# Patient Record
Sex: Male | Born: 1985 | Race: White | Hispanic: Yes | Marital: Married | Smoking: Never smoker
Health system: Southern US, Community
[De-identification: ages and names within clinical notes are randomized; demographics above are authoritative.]

## PROBLEM LIST (undated history)

## (undated) HISTORY — PX: OTHER SURGICAL HISTORY: SHX169

---

## 2008-07-30 ENCOUNTER — Inpatient Hospital Stay (HOSPITAL_COMMUNITY): Admission: EM | Admit: 2008-07-30 | Discharge: 2008-08-03 | Payer: Self-pay | Admitting: Emergency Medicine

## 2009-12-27 ENCOUNTER — Emergency Department (HOSPITAL_COMMUNITY): Admission: EM | Admit: 2009-12-27 | Discharge: 2009-12-27 | Payer: Self-pay | Admitting: Emergency Medicine

## 2010-08-25 LAB — BASIC METABOLIC PANEL
CO2: 21 mEq/L (ref 19–32)
Calcium: 8.8 mg/dL (ref 8.4–10.5)
Chloride: 108 mEq/L (ref 96–112)
GFR calc Af Amer: >60 mL/min (ref >60)
Glucose, Bld: 155 mg/dL — ABNORMAL HIGH (ref 70–99)
Potassium: 4.1 mEq/L (ref 3.5–5.1)
Sodium: 136 mEq/L (ref 135–145)

## 2010-08-25 LAB — POCT I-STAT, CHEM 8
BUN: 17 mg/dL (ref 6–23)
Chloride: 106 mEq/L (ref 96–112)
Creatinine, Ser: 1.4 mg/dL (ref 0.4–1.5)
Potassium: 3.4 mEq/L — ABNORMAL LOW (ref 3.5–5.1)
Sodium: 142 mEq/L (ref 135–145)
TCO2: 22 mmol/L (ref 0–100)

## 2010-08-25 LAB — CBC
HCT: 40.4 % (ref 39.0–52.0)
HCT: 42.7 % (ref 39.0–52.0)
Hemoglobin: 14.4 g/dL (ref 13.0–17.0)
Hemoglobin: 14.9 g/dL (ref 13.0–17.0)
MCHC: 35.3 g/dL (ref 30.0–36.0)
MCHC: 35.7 g/dL (ref 30.0–36.0)
MCV: 84.9 fL (ref 78.0–100.0)
MCV: 85.4 fL (ref 78.0–100.0)
MCV: 85.7 fL (ref 78.0–100.0)
Platelets: 230 10*3/uL (ref 150–400)
RBC: 4.38 MIL/uL (ref 4.22–5.81)
RBC: 4.76 MIL/uL (ref 4.22–5.81)
RDW: 12.9 % (ref 11.5–15.5)
RDW: 12.9 % (ref 11.5–15.5)
WBC: 12.8 10*3/uL — ABNORMAL HIGH (ref 4.0–10.5)

## 2010-08-25 LAB — TYPE AND SCREEN
ABO/RH(D): A POS
Antibody Screen: NEGATIVE

## 2010-08-25 LAB — ETHANOL: Alcohol, Ethyl (B): 88 mg/dL — ABNORMAL HIGH (ref 0–10)

## 2010-08-25 LAB — ABO/RH: ABO/RH(D): A POS
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
## 2011-04-12 ENCOUNTER — Emergency Department (HOSPITAL_COMMUNITY)
Admission: EM | Admit: 2011-04-12 | Discharge: 2011-04-12 | Disposition: A | Discharge status: Home / Self Care | Payer: Self-pay | Attending: Emergency Medicine | Admitting: Emergency Medicine

## 2011-04-12 ENCOUNTER — Encounter: Payer: Self-pay | Admitting: *Deleted

## 2011-04-12 DIAGNOSIS — R6889 Other general symptoms and signs: Secondary | ICD-10-CM | POA: Insufficient documentation

## 2011-04-12 DIAGNOSIS — M62838 Other muscle spasm: Secondary | ICD-10-CM | POA: Insufficient documentation

## 2011-04-12 DIAGNOSIS — S29011A Strain of muscle and tendon of front wall of thorax, initial encounter: Secondary | ICD-10-CM

## 2011-04-12 DIAGNOSIS — X500XXA Overexertion from strenuous movement or load, initial encounter: Secondary | ICD-10-CM | POA: Insufficient documentation

## 2011-04-12 DIAGNOSIS — M546 Pain in thoracic spine: Secondary | ICD-10-CM | POA: Insufficient documentation

## 2011-04-12 DIAGNOSIS — IMO0002 Reserved for concepts with insufficient information to code with codable children: Secondary | ICD-10-CM | POA: Insufficient documentation

## 2011-04-12 DIAGNOSIS — M25519 Pain in unspecified shoulder: Secondary | ICD-10-CM | POA: Insufficient documentation

## 2011-04-12 MED ORDER — CYCLOBENZAPRINE HCL 10 MG PO TABS: 5.0000 mg | ORAL_TABLET | Freq: Three times a day (TID) | ORAL | Status: AC | PRN

## 2011-04-12 MED ORDER — KETOROLAC TROMETHAMINE 30 MG/ML IJ SOLN
30.0000 mg | Freq: Once | INTRAMUSCULAR | Status: AC
  Administered 2011-04-12: 30 mg via INTRAMUSCULAR
  Filled 2011-04-12: qty 1

## 2011-04-12 MED ORDER — IBUPROFEN 600 MG PO TABS: 600.0000 mg | ORAL_TABLET | Freq: Four times a day (QID) | ORAL | Status: AC | PRN
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
## 2011-04-12 NOTE — ED Notes (Signed)
Pt was pulling something at work when he felt something in his back and he has had increasing pain in his right back since which has been associated with sob, pain increases when taking a deep breath

## 2011-04-13 NOTE — ED Provider Notes (Signed)
Medical screening examination/treatment/procedure(s) were performed by non-physician practitioner and as supervising physician I was immediately available for consultation/collaboration. Devoria Albe, MD, Armando Gang   Ward Givens, MD 04/13/11 [phone]

## 2011-07-31 ENCOUNTER — Emergency Department (HOSPITAL_COMMUNITY)
Admission: EM | Admit: 2011-07-31 | Discharge: 2011-07-31 | Disposition: A | Discharge status: Home / Self Care | Payer: Self-pay | Attending: Emergency Medicine | Admitting: Emergency Medicine

## 2011-07-31 ENCOUNTER — Other Ambulatory Visit: Payer: Self-pay

## 2011-07-31 ENCOUNTER — Encounter (HOSPITAL_COMMUNITY): Payer: Self-pay | Admitting: Emergency Medicine

## 2011-07-31 DIAGNOSIS — W57XXXA Bitten or stung by nonvenomous insect and other nonvenomous arthropods, initial encounter: Secondary | ICD-10-CM | POA: Insufficient documentation

## 2011-07-31 DIAGNOSIS — T148 Other injury of unspecified body region: Secondary | ICD-10-CM | POA: Insufficient documentation

## 2011-07-31 MED ORDER — SULFAMETHOXAZOLE-TRIMETHOPRIM 800-160 MG PO TABS: 1.0000 | ORAL_TABLET | Freq: Two times a day (BID) | ORAL | Status: AC

## 2011-07-31 NOTE — ED Notes (Signed)
Pt stated that he was bit by an unknown bug on last Friday. There is a small reddened area on the back of his left shin. Black scab in the middle or area. There is a larger reddened area on the front of his left shin. Scab in the middle, as well. The same type of spots of his left arm. No swelling in areas or no warmness to touch. Pt stated that he also having R sided chest pain that is intermittent, starting yesterday. No n/v or radiation of pain. Pt stated that he gets slightly SOB with pain. Will continue to monitor.

## 2011-07-31 NOTE — Discharge Instructions (Signed)
Insect Bite Mosquitoes, flies, fleas, bedbugs, and many other insects can bite. Insect bites are different from insect stings. A sting is when venom is injected into the skin. Some insect bites can transmit infectious diseases. SYMPTOMS  Insect bites usually turn red, swell, and itch for 2 to 4 days. They often go away on their own. TREATMENT  Your caregiver may prescribe antibiotic medicines if a bacterial infection develops in the bite. HOME CARE INSTRUCTIONS  Do not scratch the bite area.   Keep the bite area clean and dry. Wash the bite area thoroughly with soap and water.   Put ice or cool compresses on the bite area.   Put ice in a plastic bag.   Place a towel between your skin and the bag.   Leave the ice on for 20 minutes, 4 times a day for the first 2 to 3 days, or as directed.   You may apply a baking soda paste, cortisone cream, or calamine lotion to the bite area as directed by your caregiver. This can help reduce itching and swelling.   Only take over-the-counter or prescription medicines as directed by your caregiver.   If you are given antibiotics, take them as directed. Finish them even if you start to feel better.  You may need a tetanus shot if:  You cannot remember when you had your last tetanus shot.   You have never had a tetanus shot.   The injury broke your skin.  If you get a tetanus shot, your arm may swell, get red, and feel warm to the touch. This is common and not a problem. If you need a tetanus shot and you choose not to have one, there is a rare chance of getting tetanus. Sickness from tetanus can be serious. SEEK IMMEDIATE MEDICAL CARE IF:   You have increased pain, redness, or swelling in the bite area.   You see a red line on the skin coming from the bite.   You have a fever.   You have joint pain.   You have a headache or neck pain.   You have unusual weakness.   You have a rash.   You have chest pain or shortness of breath.   You  have abdominal pain, nausea, or vomiting.   You feel unusually tired or sleepy.  MAKE SURE YOU:   Understand these instructions.   Will watch your condition.   Will get help right away if you are not doing well or get worse.  Document Released: 06/08/2004 Document Revised: 04/20/2011 Document Reviewed: 11/30/2010 [facility] Patient Information 2012 [place], [place].  Return for any new or worsening symptoms or any other concerns.

## 2011-07-31 NOTE — ED Notes (Addendum)
Pt to ED for eval insect bite; pt reports on Friday, he was outside cleaning and he felt something bite him 2 spots on L arm and 2 L leg; sites look red and inflammed with black scab in middle; and redness noted to L upper arm
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
## 2011-08-02 NOTE — ED Provider Notes (Signed)
I saw and evaluated the patient, reviewed the resident's note and I agree with the findings and plan.   Lyanne Co, MD 08/02/11 [phone]

## 2011-12-07 ENCOUNTER — Emergency Department (HOSPITAL_COMMUNITY)
Admission: EM | Admit: 2011-12-07 | Discharge: 2011-12-07 | Disposition: A | Discharge status: Home / Self Care | Payer: Self-pay | Attending: Emergency Medicine | Admitting: Emergency Medicine

## 2011-12-07 ENCOUNTER — Encounter (HOSPITAL_COMMUNITY): Payer: Self-pay

## 2011-12-07 DIAGNOSIS — H7290 Unspecified perforation of tympanic membrane, unspecified ear: Secondary | ICD-10-CM

## 2011-12-07 DIAGNOSIS — H9209 Otalgia, unspecified ear: Secondary | ICD-10-CM | POA: Insufficient documentation

## 2011-12-07 DIAGNOSIS — H6690 Otitis media, unspecified, unspecified ear: Secondary | ICD-10-CM

## 2011-12-07 MED ORDER — OXYCODONE-ACETAMINOPHEN 5-325 MG PO TABS: 1.0000 | ORAL_TABLET | Freq: Four times a day (QID) | ORAL | Status: AC | PRN

## 2011-12-07 MED ORDER — AMOXICILLIN 500 MG PO CAPS: 500.0000 mg | ORAL_CAPSULE | Freq: Three times a day (TID) | ORAL | Status: AC
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
## 2011-12-07 NOTE — ED Notes (Signed)
Pt presents with 2 day h/o L ear pain.  Pt reports placing q-tip in ear, noting blood on tip.  Pt reports pain is constant and increases intermittently.

## 2012-05-05 ENCOUNTER — Encounter (HOSPITAL_COMMUNITY): Payer: Self-pay | Admitting: Emergency Medicine

## 2012-05-05 ENCOUNTER — Emergency Department (HOSPITAL_COMMUNITY): Payer: Self-pay

## 2012-05-05 ENCOUNTER — Emergency Department (HOSPITAL_COMMUNITY)
Admission: EM | Admit: 2012-05-05 | Discharge: 2012-05-05 | Disposition: A | Discharge status: Home / Self Care | Payer: Self-pay | Attending: Emergency Medicine | Admitting: Emergency Medicine

## 2012-05-05 DIAGNOSIS — S199XXA Unspecified injury of neck, initial encounter: Secondary | ICD-10-CM | POA: Insufficient documentation

## 2012-05-05 DIAGNOSIS — R109 Unspecified abdominal pain: Secondary | ICD-10-CM

## 2012-05-05 DIAGNOSIS — R079 Chest pain, unspecified: Secondary | ICD-10-CM

## 2012-05-05 DIAGNOSIS — S0993XA Unspecified injury of face, initial encounter: Secondary | ICD-10-CM | POA: Insufficient documentation

## 2012-05-05 DIAGNOSIS — R519 Headache, unspecified: Secondary | ICD-10-CM

## 2012-05-05 LAB — BASIC METABOLIC PANEL
Calcium: 9.6 mg/dL (ref 8.4–10.5)
Chloride: 99 mEq/L (ref 96–112)
Creatinine, Ser: 0.75 mg/dL (ref 0.50–1.35)
GFR calc Af Amer: >90 mL/min (ref >90)
Sodium: 137 mEq/L (ref 135–145)

## 2012-05-05 LAB — CBC
MCV: 83.3 fL (ref 78.0–100.0)
Platelets: 261 10*3/uL (ref 150–400)
RBC: 5.69 MIL/uL (ref 4.22–5.81)
RDW: 12.6 % (ref 11.5–15.5)
WBC: 14.8 10*3/uL — ABNORMAL HIGH (ref 4.0–10.5)

## 2012-05-05 MED ORDER — IOHEXOL 300 MG/ML  SOLN
100.0000 mL | Freq: Once | INTRAMUSCULAR | Status: AC | PRN
  Administered 2012-05-05: 100 mL via INTRAVENOUS

## 2012-05-05 MED ORDER — HYDROCODONE-ACETAMINOPHEN 5-325 MG PO TABS: 1.0000 | ORAL_TABLET | ORAL | Status: DC | PRN

## 2012-05-05 MED ORDER — SODIUM CHLORIDE 0.9 % IV SOLN
Freq: Once | INTRAVENOUS | Status: AC
  Administered 2012-05-05: 125 mL/h via INTRAVENOUS

## 2012-05-05 MED ORDER — MORPHINE SULFATE 4 MG/ML IJ SOLN
6.0000 mg | Freq: Once | INTRAMUSCULAR | Status: AC
  Administered 2012-05-05: 6 mg via INTRAVENOUS
  Filled 2012-05-05: qty 2

## 2012-05-05 MED ORDER — SODIUM CHLORIDE 0.9 % IV SOLN
1000.0000 mL | INTRAVENOUS | Status: DC
  Administered 2012-05-05: 1000 mL via INTRAVENOUS

## 2012-05-05 MED ORDER — SODIUM CHLORIDE 0.9 % IV SOLN
1000.0000 mL | Freq: Once | INTRAVENOUS | Status: AC
  Administered 2012-05-05: 1000 mL via INTRAVENOUS
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
## 2012-05-05 NOTE — ED Notes (Signed)
Bed:WA12<BR> Expected date:<BR> Expected time:<BR> Means of arrival:<BR> Comments:<BR> EMS
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
## 2012-05-05 NOTE — ED Notes (Signed)
Placed [place] Cervical collar in pt per MD.

## 2012-07-27 ENCOUNTER — Encounter (HOSPITAL_COMMUNITY): Payer: Self-pay | Admitting: Emergency Medicine

## 2012-07-27 ENCOUNTER — Emergency Department (HOSPITAL_COMMUNITY): Payer: Self-pay

## 2012-07-27 ENCOUNTER — Emergency Department (HOSPITAL_COMMUNITY)
Admission: EM | Admit: 2012-07-27 | Discharge: 2012-07-27 | Disposition: A | Discharge status: Home / Self Care | Payer: Self-pay | Attending: Emergency Medicine | Admitting: Emergency Medicine

## 2012-07-27 DIAGNOSIS — R059 Cough, unspecified: Secondary | ICD-10-CM | POA: Insufficient documentation

## 2012-07-27 DIAGNOSIS — R51 Headache: Secondary | ICD-10-CM | POA: Insufficient documentation

## 2012-07-27 DIAGNOSIS — J040 Acute laryngitis: Secondary | ICD-10-CM | POA: Insufficient documentation

## 2012-07-27 DIAGNOSIS — J209 Acute bronchitis, unspecified: Secondary | ICD-10-CM | POA: Insufficient documentation

## 2012-07-27 MED ORDER — HYDROCODONE-HOMATROPINE 5-1.5 MG/5ML PO SYRP: 5.0000 mL | ORAL_SOLUTION | Freq: Four times a day (QID) | ORAL | Status: AC | PRN

## 2012-07-27 MED ORDER — OMEPRAZOLE 20 MG PO CPDR: 20.0000 mg | DELAYED_RELEASE_CAPSULE | Freq: Every day | ORAL | Status: AC

## 2012-07-27 MED ORDER — AZITHROMYCIN 250 MG PO TABS: 250.0000 mg | ORAL_TABLET | Freq: Every day | ORAL | Status: AC

## 2012-07-27 NOTE — ED Notes (Signed)
Pt c/o voice being hoarse x's 1 week.  Pt denies sore throat or any other complaints

## 2012-07-27 NOTE — ED Provider Notes (Signed)
Medical screening examination/treatment/procedure(s) were performed by non-physician practitioner and as supervising physician I was immediately available for consultation/collaboration.    Kiev Labrosse D Jaquez Farrington, MD 07/27/12 2357 
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
## 2014-01-06 IMAGING — CR DG CHEST 2V
2 series · 2 of 2 positions shown · non-contrast
Comparison: 05/05/2012 there are.

CLINICAL DATA: Shortness of breath.  Using his voice.  Symptoms for
1 week.  Productive cough.

CHEST - 2 VIEW

[w chest pa]
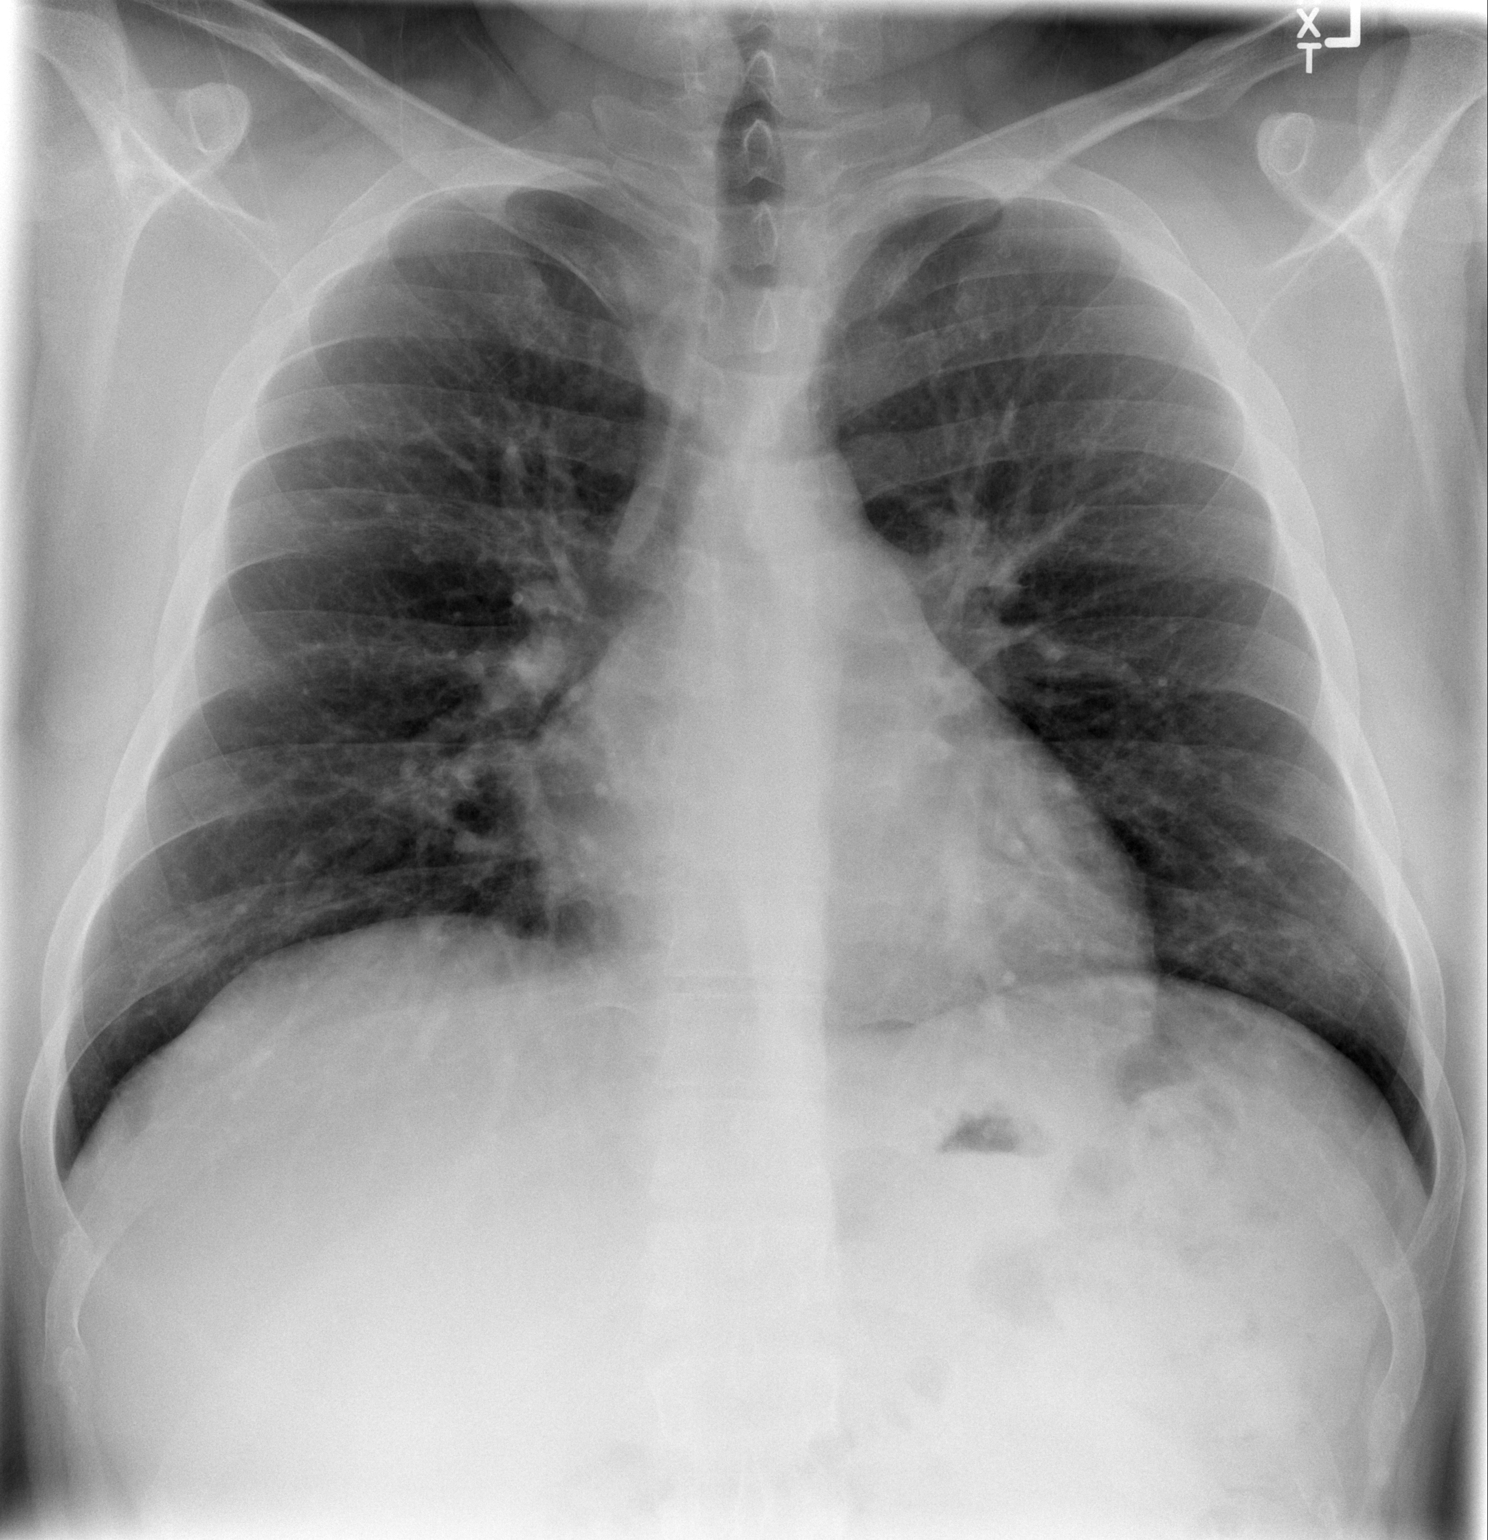

[w chest lat]
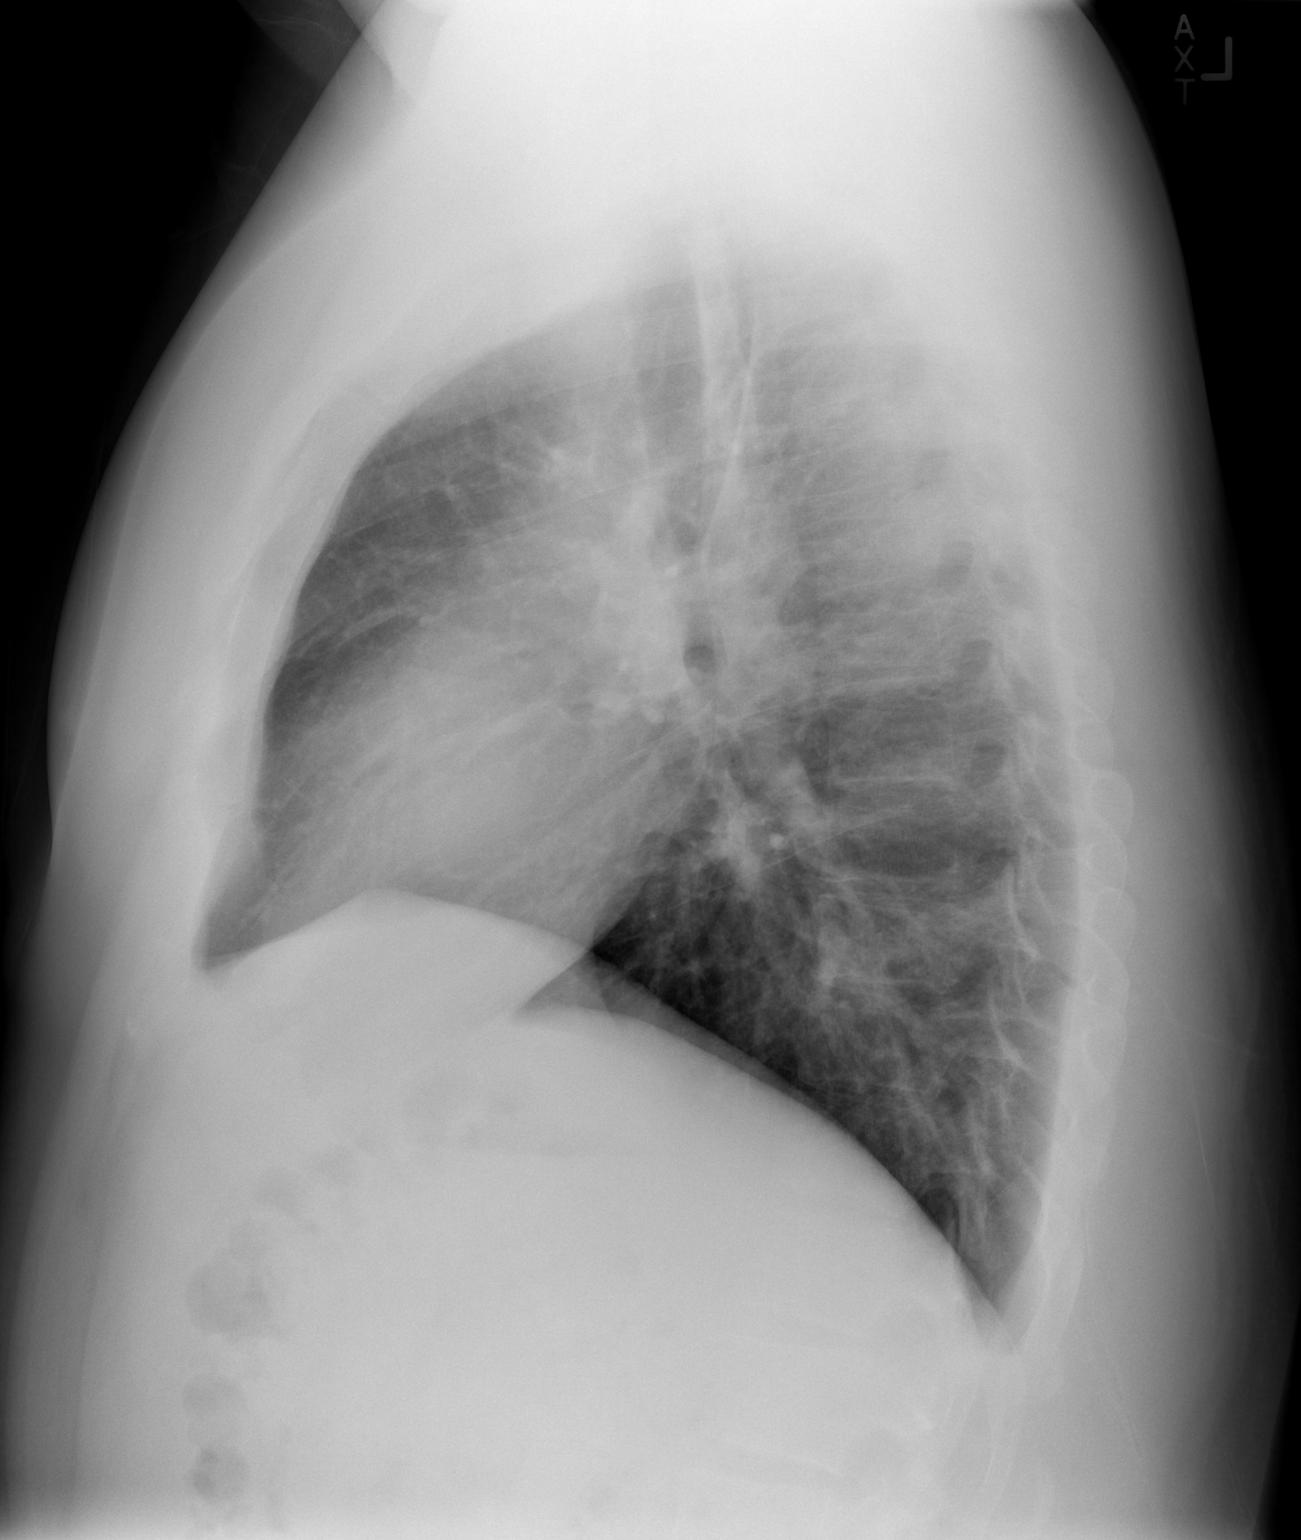

[2 of 2 positions shown; findings below may reference images not displayed]

FINDINGS: Heart size is normal.  There is perihilar peribronchial
thickening.  There are no focal consolidations or pleural
effusions. Visualized osseous structures have a normal appearance.
IMPRESSION: 1.  Bronchitic change.
2. No focal pulmonary abnormality.

## 2021-12-01 ENCOUNTER — Emergency Department (HOSPITAL_COMMUNITY)
Admission: EM | Admit: 2021-12-01 | Discharge: 2021-12-01 | Disposition: A | Discharge status: Home / Self Care | Payer: Self-pay | Attending: Emergency Medicine | Admitting: Emergency Medicine

## 2021-12-01 ENCOUNTER — Other Ambulatory Visit: Payer: Self-pay

## 2021-12-01 ENCOUNTER — Emergency Department (HOSPITAL_COMMUNITY): Payer: Self-pay

## 2021-12-01 ENCOUNTER — Encounter (HOSPITAL_COMMUNITY): Payer: Self-pay | Admitting: *Deleted

## 2021-12-01 DIAGNOSIS — M545 Low back pain, unspecified: Secondary | ICD-10-CM | POA: Insufficient documentation

## 2021-12-01 DIAGNOSIS — M79669 Pain in unspecified lower leg: Secondary | ICD-10-CM | POA: Insufficient documentation

## 2021-12-01 DIAGNOSIS — R1030 Lower abdominal pain, unspecified: Secondary | ICD-10-CM

## 2021-12-01 DIAGNOSIS — R109 Unspecified abdominal pain: Secondary | ICD-10-CM

## 2021-12-01 LAB — BASIC METABOLIC PANEL
Anion gap: 9 (ref 5–15)
BUN: 16 mg/dL (ref 6–20)
CO2: 24 mmol/L (ref 22–32)
Calcium: 9.3 mg/dL (ref 8.9–10.3)
Chloride: 106 mmol/L (ref 98–111)
Creatinine, Ser: 0.8 mg/dL (ref 0.61–1.24)
GFR, Estimated: >60 mL/min (ref >60)
Glucose, Bld: 128 mg/dL — ABNORMAL HIGH (ref 70–99)
Potassium: 3.8 mmol/L (ref 3.5–5.1)
Sodium: 139 mmol/L (ref 135–145)

## 2021-12-01 LAB — URINALYSIS, ROUTINE W REFLEX MICROSCOPIC
Bilirubin Urine: NEGATIVE
Glucose, UA: NEGATIVE mg/dL
Hgb urine dipstick: NEGATIVE
Ketones, ur: NEGATIVE mg/dL
Leukocytes,Ua: NEGATIVE
Nitrite: NEGATIVE
Protein, ur: NEGATIVE mg/dL
Specific Gravity, Urine: 1.025 (ref 1.005–1.030)
pH: 5 (ref 5.0–8.0)

## 2021-12-01 LAB — CBC WITH DIFFERENTIAL/PLATELET
Abs Immature Granulocytes: 0.04 10*3/uL (ref 0.00–0.07)
Basophils Absolute: 0 10*3/uL (ref 0.0–0.1)
Basophils Relative: 1 %
Eosinophils Absolute: 0.3 10*3/uL (ref 0.0–0.5)
Eosinophils Relative: 3 %
HCT: 44.4 % (ref 39.0–52.0)
Hemoglobin: 15.3 g/dL (ref 13.0–17.0)
Immature Granulocytes: 1 %
Lymphocytes Relative: 24 %
Lymphs Abs: 2.1 10*3/uL (ref 0.7–4.0)
MCH: 30.7 pg (ref 26.0–34.0)
MCHC: 34.5 g/dL (ref 30.0–36.0)
MCV: 89 fL (ref 80.0–100.0)
Monocytes Absolute: 0.8 10*3/uL (ref 0.1–1.0)
Monocytes Relative: 9 %
Neutro Abs: 5.6 10*3/uL (ref 1.7–7.7)
Neutrophils Relative %: 62 %
Platelets: 201 10*3/uL (ref 150–400)
RBC: 4.99 MIL/uL (ref 4.22–5.81)
RDW: 12.3 % (ref 11.5–15.5)
WBC: 8.8 10*3/uL (ref 4.0–10.5)
nRBC: 0 % (ref 0.0–0.2)

## 2021-12-01 MED ORDER — PHENAZOPYRIDINE HCL 100 MG PO TABS
200.0000 mg | ORAL_TABLET | Freq: Once | ORAL | Status: AC
  Administered 2021-12-01: 200 mg via ORAL
  Filled 2021-12-01: qty 2

## 2021-12-01 MED ORDER — SODIUM CHLORIDE 0.9 % IV BOLUS
1000.0000 mL | Freq: Once | INTRAVENOUS | Status: AC
  Administered 2021-12-01: 1000 mL via INTRAVENOUS

## 2021-12-01 MED ORDER — IOHEXOL 300 MG/ML  SOLN
100.0000 mL | Freq: Once | INTRAMUSCULAR | Status: AC | PRN
  Administered 2021-12-01: 100 mL via INTRAVENOUS

## 2021-12-01 MED ORDER — NAPROXEN 500 MG PO TABS: 500.0000 mg | ORAL_TABLET | Freq: Two times a day (BID) | ORAL | 0 refills | Status: AC

## 2021-12-01 MED ORDER — OXYCODONE-ACETAMINOPHEN 5-325 MG PO TABS
1.0000 | ORAL_TABLET | Freq: Once | ORAL | Status: AC
  Administered 2021-12-01: 1 via ORAL
  Filled 2021-12-01: qty 1

## 2021-12-01 MED ORDER — KETOROLAC TROMETHAMINE 30 MG/ML IJ SOLN
15.0000 mg | Freq: Once | INTRAMUSCULAR | Status: AC
  Administered 2021-12-01: 15 mg via INTRAVENOUS
  Filled 2021-12-01: qty 1

## 2021-12-01 MED ORDER — MORPHINE SULFATE 15 MG PO TABS
15.0000 mg | ORAL_TABLET | ORAL | Status: DC | PRN
  Filled 2021-12-01: qty 1

## 2021-12-01 MED ORDER — ACETAMINOPHEN 500 MG PO TABS: 500.0000 mg | ORAL_TABLET | Freq: Four times a day (QID) | ORAL | 0 refills | Status: AC | PRN

## 2021-12-01 MED ORDER — ACETAMINOPHEN 325 MG PO TABS
650.0000 mg | ORAL_TABLET | Freq: Four times a day (QID) | ORAL | Status: DC | PRN
Start: 2021-12-01 — End: 2021-12-02
  Administered 2021-12-01: 650 mg via ORAL
  Filled 2021-12-01: qty 2

## 2021-12-01 NOTE — ED Provider Triage Note (Signed)
  Emergency Medicine Provider Triage Evaluation Note  MRN:  [id number]  Arrival date & time: 12/01/21    Medically screening exam initiated at 6:21 AM.   CC:   Flank Pain   HPI:  Phillip Frazier is a 36 y.o. year-old male presents to the ED with chief complaint of left sided low back pain that has worsened tonight.  Can't find a comfortable position.  Denies fever or chills.  States he has had mild dysuria.  Denies hx of kidney stones.  History provided by patient. ROS:  -As included in HPI PE:   Vitals:   12/01/21 0618  BP: (!) 139/95  Pulse: 84  Resp: 18  Temp: 98.3 F (36.8 C)  SpO2: 100%    Non-toxic appearing No respiratory distress ambulatory MDM:  Based on signs and symptoms, kidney stone is highest on my differential,. I've ordered labs and imaging in triage to expedite lab/diagnostic workup.  Patient was informed that the remainder of the evaluation will be completed by another provider, this initial triage assessment does not replace that evaluation, and the importance of remaining in the ED until their evaluation is complete.    Roxy Horseman, PA-C 12/01/21 [id number]

## 2021-12-01 NOTE — Discharge Instructions (Addendum)
We saw you in the ER for flank pain and abdominal pain. All the results in the ER are normal, labs and imaging.  CT scan did not show any concerning findings. We are not sure what is causing your symptoms. The workup in the ER is not complete, and is limited to screening for life threatening and emergent conditions only, so please see a primary care doctor for further evaluation.
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
## 2021-12-01 NOTE — ED Notes (Signed)
All discharge instructions reviewed with patient and patient verbalized understanding of same. Patient stable and ambulatory at time of discharge.  ?

## 2021-12-01 NOTE — ED Triage Notes (Signed)
Patient presents to ed via [facility] states he has had lower back pain x 2 days, states he is't able to lay down denies injury. States pain upon urination at times.

## 2021-12-01 NOTE — ED Notes (Signed)
Patient transported to CT 

## 2023-01-18 ENCOUNTER — Emergency Department (HOSPITAL_BASED_OUTPATIENT_CLINIC_OR_DEPARTMENT_OTHER)
Admission: EM | Admit: 2023-01-18 | Discharge: 2023-01-18 | Disposition: A | Discharge status: Home / Self Care | Payer: Self-pay | Attending: Emergency Medicine | Admitting: Emergency Medicine

## 2023-01-18 ENCOUNTER — Other Ambulatory Visit: Payer: Self-pay

## 2023-01-18 ENCOUNTER — Encounter (HOSPITAL_BASED_OUTPATIENT_CLINIC_OR_DEPARTMENT_OTHER): Payer: Self-pay

## 2023-01-18 ENCOUNTER — Emergency Department (HOSPITAL_BASED_OUTPATIENT_CLINIC_OR_DEPARTMENT_OTHER): Payer: Self-pay

## 2023-01-18 DIAGNOSIS — R519 Headache, unspecified: Secondary | ICD-10-CM | POA: Insufficient documentation

## 2023-01-18 DIAGNOSIS — R079 Chest pain, unspecified: Secondary | ICD-10-CM | POA: Insufficient documentation

## 2023-01-18 LAB — COMPREHENSIVE METABOLIC PANEL
ALT: 107 U/L — ABNORMAL HIGH (ref 0–44)
AST: 68 U/L — ABNORMAL HIGH (ref 15–41)
Albumin: 3.9 g/dL (ref 3.5–5.0)
Alkaline Phosphatase: 70 U/L (ref 38–126)
Anion gap: 8 (ref 5–15)
BUN: 11 mg/dL (ref 6–20)
CO2: 25 mmol/L (ref 22–32)
Calcium: 8.9 mg/dL (ref 8.9–10.3)
Chloride: 103 mmol/L (ref 98–111)
Creatinine, Ser: 0.71 mg/dL (ref 0.61–1.24)
GFR, Estimated: >60 mL/min (ref >60)
Glucose, Bld: 189 mg/dL — ABNORMAL HIGH (ref 70–99)
Potassium: 3.7 mmol/L (ref 3.5–5.1)
Sodium: 136 mmol/L (ref 135–145)
Total Bilirubin: 0.4 mg/dL (ref 0.3–1.2)
Total Protein: 7.8 g/dL (ref 6.5–8.1)

## 2023-01-18 LAB — CBC WITH DIFFERENTIAL/PLATELET
Abs Immature Granulocytes: 0.03 10*3/uL (ref 0.00–0.07)
Basophils Absolute: 0 10*3/uL (ref 0.0–0.1)
Basophils Relative: 0 %
Eosinophils Absolute: 0.3 10*3/uL (ref 0.0–0.5)
Eosinophils Relative: 3 %
HCT: 43.3 % (ref 39.0–52.0)
Hemoglobin: 14.8 g/dL (ref 13.0–17.0)
Immature Granulocytes: 0 %
Lymphocytes Relative: 21 %
Lymphs Abs: 1.9 10*3/uL (ref 0.7–4.0)
MCH: 30.4 pg (ref 26.0–34.0)
MCHC: 34.2 g/dL (ref 30.0–36.0)
MCV: 88.9 fL (ref 80.0–100.0)
Monocytes Absolute: 0.8 10*3/uL (ref 0.1–1.0)
Monocytes Relative: 9 %
Neutro Abs: 6 10*3/uL (ref 1.7–7.7)
Neutrophils Relative %: 67 %
Platelets: 189 10*3/uL (ref 150–400)
RBC: 4.87 MIL/uL (ref 4.22–5.81)
RDW: 12.5 % (ref 11.5–15.5)
WBC: 9 10*3/uL (ref 4.0–10.5)
nRBC: 0 % (ref 0.0–0.2)

## 2023-01-18 LAB — TROPONIN I (HIGH SENSITIVITY): Troponin I (High Sensitivity): 3 ng/L (ref <18)

## 2023-01-18 LAB — LIPASE, BLOOD: Lipase: 63 U/L — ABNORMAL HIGH (ref 11–51)

## 2023-01-18 MED ORDER — DIPHENHYDRAMINE HCL 50 MG/ML IJ SOLN
12.5000 mg | Freq: Once | INTRAMUSCULAR | Status: AC
  Administered 2023-01-18: 12.5 mg via INTRAVENOUS
  Filled 2023-01-18: qty 1

## 2023-01-18 MED ORDER — PROCHLORPERAZINE EDISYLATE 10 MG/2ML IJ SOLN
10.0000 mg | Freq: Once | INTRAMUSCULAR | Status: AC
  Administered 2023-01-18: 10 mg via INTRAVENOUS
  Filled 2023-01-18: qty 2

## 2023-01-18 MED ORDER — ACETAMINOPHEN 325 MG PO TABS
650.0000 mg | ORAL_TABLET | Freq: Once | ORAL | Status: AC
  Administered 2023-01-18: 650 mg via ORAL
  Filled 2023-01-18: qty 2

## 2023-01-18 NOTE — ED Provider Notes (Signed)
[place] EMERGENCY DEPARTMENT AT [facility] Provider Note   CSN: [id number] Arrival date & time: 01/18/23  2104     History  Chief Complaint  Patient presents with   Chest Pain    Phillip Frazier is a 37 y.o. male.  Patient here with headache and chest pain after getting into argument.  He is having some tension to the back of his head, chest pain shortness of breath but improving.  Nothing makes it worse or better.  Happened just prior to arrival.  Maybe had some mild pressure yesterday but things seem to get worse just after this argument before coming here.  He felt like the right side of his eye was twitching.  He is not having any weakness numbness tingling vision loss.  Denies any alcohol or drug use.  Denies any recent surgery or travel.  Feeling better now.  The history is provided by the patient.       Home Medications Prior to Admission medications   Medication Sig Start Date End Date Taking? Authorizing Provider  acetaminophen (TYLENOL) 500 MG tablet Take 1 tablet (500 mg total) by mouth every 6 (six) hours as needed. 12/01/21   Derwood Kaplan, MD  azithromycin (ZITHROMAX) 250 MG tablet Take 1 tablet (250 mg total) by mouth daily. Take first 2 tablets together, then 1 every day until finished. 07/27/12   Muthersbaugh, Dahlia Client, PA-C  HYDROcodone-homatropine (HYCODAN) 5-1.5 MG/5ML syrup Take 5 mLs by mouth every 6 (six) hours as needed for cough. 07/27/12   Muthersbaugh, Dahlia Client, PA-C  naproxen (NAPROSYN) 500 MG tablet Take 1 tablet (500 mg total) by mouth 2 (two) times daily. 12/01/21   Derwood Kaplan, MD  omeprazole (PRILOSEC) 20 MG capsule Take 1 capsule (20 mg total) by mouth daily. 07/27/12   Muthersbaugh, Dahlia Client, PA-C      Allergies    Patient has no known allergies.    Review of Systems   Review of Systems  Physical Exam Updated Vital Signs BP (!) 155/83 (BP Location: Left Arm)   Pulse 77   Temp 98 F (36.7 C) (Oral)   Resp 18   Ht 5\' 7"   (1.702 m)   Wt 115.7 kg   SpO2 99%   BMI 39.94 kg/m  Physical Exam Vitals and nursing note reviewed.  Constitutional:      General: He is not in acute distress.    Appearance: He is well-developed. He is not ill-appearing.  HENT:     Head: Normocephalic and atraumatic.  Eyes:     Extraocular Movements: Extraocular movements intact.     Conjunctiva/sclera: Conjunctivae normal.     Pupils: Pupils are equal, round, and reactive to light.  Cardiovascular:     Rate and Rhythm: Normal rate and regular rhythm.     Pulses:          Radial pulses are 2+ on the right side and 2+ on the left side.     Heart sounds: Normal heart sounds. No murmur heard. Pulmonary:     Effort: Pulmonary effort is normal. No respiratory distress.     Breath sounds: Normal breath sounds. No decreased breath sounds or wheezing.  Abdominal:     Palpations: Abdomen is soft.     Tenderness: There is no abdominal tenderness.  Musculoskeletal:        General: No swelling.     Cervical back: Normal range of motion and neck supple.  Skin:    General: Skin is warm and  dry.     Capillary Refill: Capillary refill takes less than 2 seconds.  Neurological:     General: No focal deficit present.     Mental Status: He is alert.     Comments: 5+ out of 5 strength, normal sensation, normal gait, normal finger-nose-finger, normal speech, normal finger-nose-finger, normal speech normal visual fields  Psychiatric:        Mood and Affect: Mood normal.     ED Results / Procedures / Treatments   Labs (all labs ordered are listed, but only abnormal results are displayed) Labs Reviewed  COMPREHENSIVE METABOLIC PANEL - Abnormal; Notable for the following components:      Result Value   Glucose, Bld 189 (*)    AST 68 (*)    ALT 107 (*)    All other components within normal limits  LIPASE, BLOOD - Abnormal; Notable for the following components:   Lipase 63 (*)    All other components within normal limits  CBC WITH  DIFFERENTIAL/PLATELET  TROPONIN I (HIGH SENSITIVITY)    EKG None  Radiology CT Head Wo Contrast  Result Date: 01/18/2023 CLINICAL DATA:  Headache, increasing frequency or severity EXAM: CT HEAD WITHOUT CONTRAST TECHNIQUE: Contiguous axial images were obtained from the base of the skull through the vertex without intravenous contrast. RADIATION DOSE REDUCTION: This exam was performed according to the departmental dose-optimization program which includes automated exposure control, adjustment of the mA and/or kV according to patient size and/or use of iterative reconstruction technique. COMPARISON:  None Available. FINDINGS: Brain: No evidence of large-territorial acute infarction. No parenchymal hemorrhage. No mass lesion. No extra-axial collection. No mass effect or midline shift. No hydrocephalus. Basilar cisterns are patent. Vascular: No hyperdense vessel. Skull: No acute fracture or focal lesion. Sinuses/Orbits: Left maxillary sinus mucosal thickening. Paranasal sinuses and mastoid air cells are clear. The orbits are unremarkable. Other: None. IMPRESSION: No acute intracranial abnormality. Electronically Signed   By: Tish Frederickson M.D.   On: 01/18/2023 22:22   DG Chest Portable 1 View  Result Date: 01/18/2023 CLINICAL DATA:  Chest pain EXAM: PORTABLE CHEST 1 VIEW COMPARISON:  Radiographs 12/01/2021 FINDINGS: Normal cardiomediastinal silhouette. No focal consolidation, pleural effusion, or pneumothorax. No displaced rib fractures. IMPRESSION: No acute cardiopulmonary disease. Electronically Signed   By: Minerva Fester M.D.   On: 01/18/2023 22:21    Procedures Procedures    Medications Ordered in ED Medications  acetaminophen (TYLENOL) tablet 650 mg (650 mg Oral Given 01/18/23 2150)  prochlorperazine (COMPAZINE) injection 10 mg (10 mg Intravenous Given 01/18/23 2220)  diphenhydrAMINE (BENADRYL) injection 12.5 mg (12.5 mg Intravenous Given 01/18/23 2219)    ED Course/ Medical Decision Making/  A&P                                 Medical Decision Making Amount and/or Complexity of Data Reviewed Labs: ordered. Radiology: ordered.  Risk OTC drugs. Prescription drug management.   Phillip Frazier is here with headache, chest pain, shortness of breath.  Overall patient with some symptoms after getting into an argument with family tonight.  He is having some mild headache yesterday but things seem to get a little bit worse after this verbal argument with family.  He is having some pressure and fullness in the back of his head.  He is having some tingling to his right eye.  He did not have any weakness numbness despite triage note.  When  I talked with him he was just feeling kind of weak all over, fluttering in the right side of his face.  Does not describe seizure activity.  Did not lose consciousness.  He is not having any chest pain or shortness of breath anymore.  He denies any alcohol use or drug use.  Has not been sleeping well.  Been under some stress.  Overall he appears well.  He is neurologically intact.  EKG shows sinus rhythm.  No ischemic changes.  Differential diagnosis likely tension migraine process.  But will get head CT.  I have no concern for stroke.  I have no concern for thrombosis.  I have no concern for dissection.  As far as chest pain will be conservative and get chest x-ray, troponin and basic labs.  He does not describe ischemic sounding chest pain.  He is not having any shortness of breath anymore.  No hypoxia or tachycardia.  No concern for blood clot.  Will give headache cocktail with Compazine and Benadryl.  Will reevaluate.  Lab work per my review interpretation is unremarkable.  There is no significant anemia, electrolyte abnormality, kidney injury or leukocytosis.  Troponin normal.  Headaches improved.  I was called to the bedside that patient is feeling better and wants to leave.  He is able to ambulate without any issues.  Head CT has resulted and is  unremarkable per radiology report.  Chest x-ray shows no evidence of pneumonia pneumothorax.  Overall he is having improvement.  I would have like to watch him for little bit longer to make sure he was feeling truly better but patient feels good enough to leave which I think is reasonable.  He was discharged in good condition.  Understands return precautions.  This chart was dictated using voice recognition software.  Despite best efforts to proofread,  errors can occur which can change the documentation meaning.         Final Clinical Impression(s) / ED Diagnoses Final diagnoses:  Nonspecific chest pain  Nonintractable headache, unspecified chronicity pattern, unspecified headache type    Rx / DC Orders ED Discharge Orders     None         Virgina Norfolk, DO 01/18/23 2252

## 2023-01-18 NOTE — ED Triage Notes (Signed)
Pt has multiple complaints Pt states he has had a HA (back of head) that started yesterday and has become worse Only could work 1/2 day because he felt weak CP and SHOB started 1 hr ago Feels like his rt. Side is weak and rt. Side of face feels heavy.   Equal strength in both arms and legs PEARL NIH 0

## 2023-01-18 NOTE — Discharge Instructions (Signed)
Follow-up with primary care doctor.  If you do not have 1 follow-up with the wellness clinic.  Please return if symptoms worsen.  Continue Tylenol and ibuprofen for headaches.
